# Patient Record
Sex: Female | Born: 1960 | Race: Black or African American | Hispanic: No | Marital: Single | Smoking: Never smoker
Health system: Southern US, Community
[De-identification: ages and names within clinical notes are randomized; demographics above are authoritative.]

## PROBLEM LIST (undated history)

## (undated) DIAGNOSIS — I10 Essential (primary) hypertension: Secondary | ICD-10-CM

## (undated) DIAGNOSIS — R52 Pain, unspecified: Secondary | ICD-10-CM

## (undated) DIAGNOSIS — E119 Type 2 diabetes mellitus without complications: Secondary | ICD-10-CM

---

## 2015-07-23 ENCOUNTER — Emergency Department (HOSPITAL_BASED_OUTPATIENT_CLINIC_OR_DEPARTMENT_OTHER)
Admission: EM | Admit: 2015-07-23 | Discharge: 2015-07-23 | Disposition: A | Discharge status: Home / Self Care | Payer: Worker's Compensation | Attending: Emergency Medicine | Admitting: Emergency Medicine

## 2015-07-23 ENCOUNTER — Encounter (HOSPITAL_BASED_OUTPATIENT_CLINIC_OR_DEPARTMENT_OTHER): Payer: Self-pay

## 2015-07-23 ENCOUNTER — Emergency Department (HOSPITAL_BASED_OUTPATIENT_CLINIC_OR_DEPARTMENT_OTHER): Payer: Worker's Compensation

## 2015-07-23 DIAGNOSIS — M25512 Pain in left shoulder: Secondary | ICD-10-CM | POA: Diagnosis present

## 2015-07-23 DIAGNOSIS — Y999 Unspecified external cause status: Secondary | ICD-10-CM | POA: Insufficient documentation

## 2015-07-23 DIAGNOSIS — E119 Type 2 diabetes mellitus without complications: Secondary | ICD-10-CM | POA: Insufficient documentation

## 2015-07-23 DIAGNOSIS — Y92219 Unspecified school as the place of occurrence of the external cause: Secondary | ICD-10-CM | POA: Diagnosis not present

## 2015-07-23 DIAGNOSIS — I10 Essential (primary) hypertension: Secondary | ICD-10-CM | POA: Diagnosis not present

## 2015-07-23 DIAGNOSIS — S7002XA Contusion of left hip, initial encounter: Secondary | ICD-10-CM

## 2015-07-23 DIAGNOSIS — Z79899 Other long term (current) drug therapy: Secondary | ICD-10-CM | POA: Diagnosis not present

## 2015-07-23 DIAGNOSIS — S43402A Unspecified sprain of left shoulder joint, initial encounter: Secondary | ICD-10-CM | POA: Diagnosis not present

## 2015-07-23 DIAGNOSIS — Y939 Activity, unspecified: Secondary | ICD-10-CM | POA: Insufficient documentation

## 2015-07-23 DIAGNOSIS — W010XXA Fall on same level from slipping, tripping and stumbling without subsequent striking against object, initial encounter: Secondary | ICD-10-CM | POA: Diagnosis not present

## 2015-07-23 DIAGNOSIS — Z7984 Long term (current) use of oral hypoglycemic drugs: Secondary | ICD-10-CM | POA: Insufficient documentation

## 2015-07-23 DIAGNOSIS — W19XXXA Unspecified fall, initial encounter: Secondary | ICD-10-CM

## 2015-07-23 HISTORY — DX: Essential (primary) hypertension: I10

## 2015-07-23 HISTORY — DX: Pain, unspecified: R52

## 2015-07-23 HISTORY — DX: Type 2 diabetes mellitus without complications: E11.9

## 2015-07-23 MED ORDER — ACETAMINOPHEN 325 MG PO TABS
650.0000 mg | ORAL_TABLET | Freq: Once | ORAL | Status: AC
  Administered 2015-07-23: 650 mg via ORAL
  Filled 2015-07-23: qty 2

## 2015-07-23 NOTE — ED Notes (Signed)
Patient transported to X-ray 

## 2015-07-23 NOTE — ED Notes (Signed)
Pt states she fell on floor stripper at school approx 45 min PTA-pain to left shoulder, arm, and hip-NAD-steady gait

## 2015-07-23 NOTE — ED Provider Notes (Signed)
CSN: [id number]     Arrival date & time 07/23/15  1326 History   First MD Initiated Contact with Patient 07/23/15 1351     Chief Complaint  Patient presents with  . Fall     (Consider location/radiation/quality/duration/timing/severity/associated sxs/prior Treatment) HPI  55 year old female presents after slipping on floor stripper at school. 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 Did not hit her head and has no headache. Feels some neck stiffness on the left lateral neck. Mostly is complaining of left shoulder pain with decreased range of motion with pain whenever raising it above 90. Also left hip/iliac crest pain. No weakness or numbness. No chest or abdominal pain. No back pain. Sent here by work to get checked out. Normal ambulation.  Past Medical History  Diagnosis Date  . Diabetes mellitus without complication (HCC)   . Hypertension   . Pain    History reviewed. No pertinent past surgical history. No family history on file. Social History  Substance Use Topics  . Smoking status: Never Smoker   . Smokeless tobacco: None  . Alcohol Use: Yes     Comment: occ   OB History    No data available     Review of Systems  Gastrointestinal: Negative for vomiting.  Musculoskeletal: Positive for arthralgias and neck pain.  Neurological: Negative for dizziness, weakness, numbness and headaches.  All other systems reviewed and are negative.     Allergies  Review of patient's allergies indicates no known allergies.  Home Medications   Prior to Admission medications   Medication Sig Start Date End Date Taking? Authorizing Provider  amLODipine-benazepril (LOTREL) 5-40 MG capsule Take 1 capsule by mouth daily.   Yes Historical Provider, MD  baclofen (LIORESAL) 10 MG tablet Take 10 mg by mouth 3 (three) times daily.   Yes Historical Provider, MD  DULoxetine (CYMBALTA) 60 MG capsule Take 60 mg by mouth  daily.   Yes Historical Provider, MD  gabapentin (NEURONTIN) 100 MG capsule Take 100 mg by mouth 3 (three) times daily.   Yes Historical Provider, MD  hydrochlorothiazide (HYDRODIURIL) 12.5 MG tablet Take 12.5 mg by mouth daily.   Yes Historical Provider, MD  meloxicam (MOBIC) 7.5 MG tablet Take 7.5 mg by mouth daily.   Yes Historical Provider, MD  METFORMIN HCL PO Take by mouth.   Yes Historical Provider, MD  metoCLOPramide (REGLAN) 5 MG tablet Take 5 mg by mouth 4 (four) times daily.   Yes Historical Provider, MD  pioglitazone (ACTOS) 30 MG tablet Take 30 mg by mouth daily.   Yes Historical Provider, MD  ranitidine (ZANTAC) 150 MG tablet Take 150 mg by mouth 2 (two) times daily.   Yes Historical Provider, MD  trospium (SANCTURA) 20 MG tablet Take 20 mg by mouth 2 (two) times daily.   Yes Historical Provider, MD   BP 134/74 mmHg  Pulse 94  Temp(Src) 98.1 F (36.7 C) (Oral)  Resp 18  Ht 5\' 4"  (1.626 m)  Wt 170 lb (77.111 kg)  BMI 29.17 kg/m2  SpO2 100% Physical Exam  Constitutional: She is oriented to person, place, and time. She appears well-developed and well-nourished.  HENT:  Head: Normocephalic and atraumatic.  Right Ear: External ear normal.  Left Ear: External ear normal.  Nose: Nose normal.  Eyes: Right eye exhibits no discharge. Left eye exhibits no discharge.  Neck: Normal range of motion. Neck supple. Muscular  tenderness (mild) present. No spinous process tenderness present. Normal range of motion present.    Cardiovascular: Normal rate, regular rhythm and normal heart sounds.   Pulses:      Radial pulses are 2+ on the right side, and 2+ on the left side.  Pulmonary/Chest: Effort normal and breath sounds normal.  Abdominal: Soft. She exhibits no distension. There is no tenderness.  Musculoskeletal:       Left shoulder: She exhibits decreased range of motion (active, normal passive ROM but with pain beyond 90 degrees) and tenderness. She exhibits no deformity, normal  pulse and normal strength.       Left elbow: She exhibits normal range of motion. No tenderness found.       Left hip: She exhibits tenderness (mild, over left iliac crest).       Left knee: No tenderness found.       Left upper arm: She exhibits no tenderness.       Left upper leg: She exhibits no tenderness.  Neurological: She is alert and oriented to person, place, and time.  Skin: Skin is warm and dry.  Nursing note and vitals reviewed.   ED Course  Procedures (including critical care time) Labs Review Labs Reviewed - No data to display  Imaging Review Dg Shoulder Left  07/23/2015  CLINICAL DATA:  Left shoulder pain after fall today EXAM: LEFT SHOULDER - 2+ VIEW COMPARISON:  None. FINDINGS: No fracture, dislocation or suspicious focal osseous lesion. Mild osteoarthritis in the left acromioclavicular joint. No evidence of left acromioclavicular joint separation. Small enthesophyte at the left coracoid process. Left glenohumeral joint appears within normal limits. Mild hypertrophic change at the left greater tuberosity. IMPRESSION: No fracture or malalignment in the left shoulder. Mild degenerative changes as described. Electronically Signed   By: Delbert PhenixJason A Poff M.D.   On: 07/23/2015 14:21   Dg Hip Unilat With Pelvis 2-3 Views Left  07/23/2015  CLINICAL DATA:  Pain following fall EXAM: DG HIP (WITH OR WITHOUT PELVIS) 2-3V LEFT COMPARISON:  None. FINDINGS: Frontal pelvis as well as frontal and lateral left hip images were obtained. There is no fracture or dislocation. The joint spaces appear normal. No erosive change. Calcification in the pelvis likely represents uterine leiomyomatous change. IMPRESSION: No fracture or dislocation. No apparent arthropathy. Calcification in the pelvis likely due to uterine leiomyomas. Electronically Signed   By: Bretta BangWilliam  Woodruff III M.D.   On: 07/23/2015 14:22   I have personally reviewed and evaluated these images and lab results as part of my medical  decision-making.   EKG Interpretation None      MDM   Final diagnoses:  Fall, initial encounter  Shoulder sprain, left, initial encounter  Contusion of left hip, initial encounter    Patient with a mechanical fall and left-sided injuries. Neurovascularly intact. X-rays are unremarkable. No midline neck tenderness or signs of head injury. Patient will be discharged home with Tylenol and ice. States she cannot take ibuprofen due to stomach issues. Discussed return precautions and follow-up with PCP if not improving.    Nancy LovelessScott Charolett Yarrow, MD 07/23/15 [phone]

## 2017-09-15 ENCOUNTER — Emergency Department (HOSPITAL_BASED_OUTPATIENT_CLINIC_OR_DEPARTMENT_OTHER): Payer: BC Managed Care – PPO

## 2017-09-15 ENCOUNTER — Encounter (HOSPITAL_BASED_OUTPATIENT_CLINIC_OR_DEPARTMENT_OTHER): Payer: Self-pay | Admitting: Emergency Medicine

## 2017-09-15 ENCOUNTER — Other Ambulatory Visit: Payer: Self-pay

## 2017-09-15 ENCOUNTER — Emergency Department (HOSPITAL_BASED_OUTPATIENT_CLINIC_OR_DEPARTMENT_OTHER)
Admission: EM | Admit: 2017-09-15 | Discharge: 2017-09-15 | Disposition: A | Discharge status: Home / Self Care | Payer: BC Managed Care – PPO | Attending: Emergency Medicine | Admitting: Emergency Medicine

## 2017-09-15 DIAGNOSIS — E119 Type 2 diabetes mellitus without complications: Secondary | ICD-10-CM | POA: Insufficient documentation

## 2017-09-15 DIAGNOSIS — M25562 Pain in left knee: Secondary | ICD-10-CM | POA: Diagnosis present

## 2017-09-15 DIAGNOSIS — Z79899 Other long term (current) drug therapy: Secondary | ICD-10-CM | POA: Diagnosis not present

## 2017-09-15 DIAGNOSIS — M5136 Other intervertebral disc degeneration, lumbar region: Secondary | ICD-10-CM

## 2017-09-15 DIAGNOSIS — M5432 Sciatica, left side: Secondary | ICD-10-CM

## 2017-09-15 DIAGNOSIS — Z7984 Long term (current) use of oral hypoglycemic drugs: Secondary | ICD-10-CM | POA: Insufficient documentation

## 2017-09-15 DIAGNOSIS — M51369 Other intervertebral disc degeneration, lumbar region without mention of lumbar back pain or lower extremity pain: Secondary | ICD-10-CM

## 2017-09-15 DIAGNOSIS — I10 Essential (primary) hypertension: Secondary | ICD-10-CM | POA: Insufficient documentation

## 2017-09-15 MED ORDER — KETOROLAC TROMETHAMINE 60 MG/2ML IM SOLN
60.0000 mg | Freq: Once | INTRAMUSCULAR | Status: AC
  Administered 2017-09-15: 60 mg via INTRAMUSCULAR
  Filled 2017-09-15: qty 2

## 2017-09-15 MED ORDER — HYDROCODONE-ACETAMINOPHEN 5-325 MG PO TABS: 1.0000 | ORAL_TABLET | Freq: Four times a day (QID) | ORAL | 0 refills | Status: AC | PRN

## 2017-09-15 MED ORDER — NAPROXEN 375 MG PO TABS: 375.0000 mg | ORAL_TABLET | Freq: Two times a day (BID) | ORAL | 0 refills | Status: AC

## 2017-09-15 MED ORDER — PREDNISONE 20 MG PO TABS: 60.0000 mg | ORAL_TABLET | Freq: Every day | ORAL | 0 refills | Status: AC

## 2017-09-15 MED ORDER — PREDNISONE 10 MG PO TABS
60.0000 mg | ORAL_TABLET | Freq: Once | ORAL | Status: AC
  Administered 2017-09-15: 60 mg via ORAL
  Filled 2017-09-15: qty 1

## 2017-09-15 NOTE — ED Triage Notes (Signed)
PT presents with c/o left knee pain for a week

## 2017-09-15 NOTE — ED Notes (Signed)
Pt understood dc material. NAD noted. 

## 2017-09-15 NOTE — ED Notes (Signed)
Patient transported to XR. 

## 2017-09-15 NOTE — ED Provider Notes (Signed)
[facility] EMERGENCY DEPARTMENT Provider Note   CSN: [id number] Arrival date & time: 09/15/17  1935     History   Chief Complaint Chief Complaint  Patient presents with  . Knee Pain    HPI Nancy Robinson is a 57 y.o. female.  HPI 57 year old female with past medical history of hypertension and diabetes here with left leg pain.  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  She just had a steroid injection there this week which did not improve it.  She has had aching, throbbing, left knee pain for the last week and a half, since she returned to work.  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  She denies any direct trauma.  She has not had any fever, redness, or pain with range of motion.  Patient has also had some left hip pain and shooting, stabbing pain down her posterior left leg down to her feet.  She feels hot tingling as well.  This is also new since she went back to work.  She did not fall.  No loss of bowel or bladder function.  No weakness or numbness.  Past Medical History:  Diagnosis Date  . Diabetes mellitus without complication (HCC)   . Hypertension   . Pain     There are no active problems to display for this patient.   History reviewed. No pertinent surgical history.   OB History   None      Home Medications    Prior to Admission medications   Medication Sig Start Date End Date Taking? Authorizing Provider  amLODipine-benazepril (LOTREL) 5-40 MG capsule Take 1 capsule by mouth daily.    [provider]  baclofen (LIORESAL) 10 MG tablet Take 10 mg by mouth 3 (three) times daily.    [provider]  DULoxetine (CYMBALTA) 60 MG capsule Take 60 mg by mouth daily.    [provider]  gabapentin (NEURONTIN) 100 MG capsule Take 100 mg by mouth 3 (three) times daily.    [provider]  hydrochlorothiazide (HYDRODIURIL) 12.5 MG tablet Take 12.5 mg  by mouth daily.    [provider]  HYDROcodone-acetaminophen (NORCO/VICODIN) 5-325 MG tablet Take 1-2 tablets by mouth every 6 (six) hours as needed for severe pain. 09/15/17   Shaune PollackIsaacs, Sofiya Ezelle, MD  meloxicam (MOBIC) 7.5 MG tablet Take 7.5 mg by mouth daily.    [provider]  METFORMIN HCL PO Take by mouth.    [provider]  metoCLOPramide (REGLAN) 5 MG tablet Take 5 mg by mouth 4 (four) times daily.    [provider]  naproxen (NAPROSYN) 375 MG tablet Take 1 tablet (375 mg total) by mouth 2 (two) times daily with a meal for 7 days. 09/15/17 09/22/17  Shaune PollackIsaacs, Darcel Frane, MD  pioglitazone (ACTOS) 30 MG tablet Take 30 mg by mouth daily.    [provider]  predniSONE (DELTASONE) 20 MG tablet Take 3 tablets (60 mg total) by mouth daily for 5 days. 09/15/17 09/20/17  Shaune PollackIsaacs, Velia Pamer, MD  ranitidine (ZANTAC) 150 MG tablet Take 150 mg by mouth 2 (two) times daily.    [provider]  trospium (SANCTURA) 20 MG tablet Take 20 mg by mouth 2 (two) times daily.    [provider]    Family History No family history on file.  Social History Social History   Tobacco Use  . Smoking status: Never  Smoker  Substance Use Topics  . Alcohol use: Yes    Comment: occ  . Drug use: No     Allergies   Patient has no known allergies.   Review of Systems Review of Systems  Constitutional: Negative for chills, fatigue and fever.  HENT: Negative for congestion and rhinorrhea.   Eyes: Negative for visual disturbance.  Respiratory: Negative for cough, shortness of breath and wheezing.   Cardiovascular: Negative for chest pain and leg swelling.  Gastrointestinal: Negative for abdominal pain, diarrhea, nausea and vomiting.  Genitourinary: Negative for dysuria and flank pain.  Musculoskeletal: Positive for arthralgias, gait problem and joint swelling. Negative for neck pain and neck stiffness.  Skin: Negative for rash and wound.  Allergic/Immunologic:  Negative for immunocompromised state.  Neurological: Negative for syncope, weakness and headaches.  All other systems reviewed and are negative.    Physical Exam Updated Vital Signs BP 124/76   Pulse 72   Temp 98.6 F (37 C) (Oral)   Resp 18   Ht 5\' 4"  (1.626 m)   Wt 82.6 kg   SpO2 98%   BMI 31.24 kg/m   Physical Exam  Constitutional: She is oriented to person, place, and time. She appears well-developed and well-nourished. No distress.  HENT:  Head: Normocephalic and atraumatic.  Eyes: Conjunctivae are normal.  Neck: Neck supple.  Cardiovascular: Normal rate, regular rhythm and normal heart sounds. Exam reveals no friction rub.  No murmur heard. Pulmonary/Chest: Effort normal and breath sounds normal. No respiratory distress. She has no wheezes. She has no rales.  Abdominal: She exhibits no distension.  Musculoskeletal: She exhibits no edema.  Moderate tenderness over the left SI joint overlying sciatic nerve.  Positive straight leg raise on the left.    Neurological: She is alert and oriented to person, place, and time. She exhibits normal muscle tone.  Skin: Skin is warm. Capillary refill takes less than 2 seconds.  Psychiatric: She has a normal mood and affect.  Nursing note and vitals reviewed.   Marland KitchenSpine Exam: Inspection/Palpation: Moderate TTP over left SI joint. Positive straight leg raise test on left. Strength: 5/5 throughout LE bilaterally (hip flexion/extension, adduction/abduction; knee flexion/extension; foot dorsiflexion/plantarflexion, inversion/eversion; great toe inversion) Sensation: Intact to light touch in proximal and distal LE bilaterally Reflexes: 2+ quadriceps and achilles reflexes   LOWER EXTREMITY EXAM: LEFT  INSPECTION & PALPATION: Moderate effusion/swelling overlying left knee. No erythema, warmth. No pain with pROM. Mild diffuse TTP over anterior and lateral joint lines. No laxity.  SENSORY: sensation is intact to light touch in:    Superficial peroneal nerve distribution (over dorsum of foot) Deep peroneal nerve distribution (over first dorsal web space) Sural nerve distribution (over lateral aspect 5th metatarsal) Saphenous nerve distribution (over medial instep)  MOTOR:  + Motor EHL (great toe dorsiflexion) + FHL (great toe plantar flexion)  + TA (ankle dorsiflexion)  + GSC (ankle plantar flexion)  VASCULAR: 2+ dorsalis pedis and posterior tibialis pulses Capillary refill < 2 sec, toes warm and well-perfused  COMPARTMENTS: Soft, warm, well-perfused No pain with passive extension No parethesias    ED Treatments / Results  Labs (all labs ordered are listed, but only abnormal results are displayed) Labs Reviewed - No data to display  EKG None  Radiology Dg Lumbar Spine Complete  Result Date: 09/15/2017 CLINICAL DATA:  57 year old female with back pain. EXAM: LUMBAR SPINE - COMPLETE 4+ VIEW COMPARISON:  Abdominal radiograph dated 08/20/2015 and lumbar spine radiograph dated 04/09/2011 FINDINGS: There is no acute  fracture or subluxation of the lumbar spine. There is multilevel degenerative changes with endplate irregularity and disc space narrowing primarily at L4-L5. There is apparent mild narrowing of the neural foramina at this level. Lower lumbar facet arthropathy noted. There is grade 1 L5-S1 retrolisthesis. Amorphous calcific density in the pelvis most consistent with fibroid. The soft tissues are grossly unremarkable. IMPRESSION: 1. No acute/traumatic lumbar spine pathology. 2. Degenerative changes. Electronically Signed   By: Elgie Collard M.D.   On: 09/15/2017 22:18   Dg Knee Complete 4 Views Left  Result Date: 09/15/2017 CLINICAL DATA:  57 year old female with left knee pain. EXAM: LEFT KNEE - COMPLETE 4+ VIEW COMPARISON:  None. FINDINGS: There is no acute fracture or dislocation. Minimal arthritic changes and bone spurring along the posterior aspect of the lateral tibial plateau. The bones are  mildly osteopenic. No joint effusion. There is swelling of the subcutaneous soft tissues over the patella and patella tendon which may represent a small hematoma or contusion. Clinical correlation is recommended. IMPRESSION: 1. No acute fracture or dislocation. 2. Small contusion or hematoma in the subcutaneous soft tissues of the anterior knee. Electronically Signed   By: Elgie Collard M.D.   On: 09/15/2017 22:23    Procedures Procedures (including critical care time)  Medications Ordered in ED Medications  ketorolac (TORADOL) injection 60 mg (60 mg Intramuscular Given 09/15/17 2213)  predniSONE (DELTASONE) tablet 60 mg (60 mg Oral Given 09/15/17 2213)     Initial Impression / Assessment and Plan / ED Course  I have reviewed the triage vital signs and the nursing notes.  Pertinent labs & imaging results that were available during my care of the patient were reviewed by me and considered in my medical decision making (see chart for details).     57 yo F here with left knee and back pain. Regarding her knee pain, this is a chronic, ongoing issue 2/2 OA. She just had a steroid injection. She does have some swelling there but no erythema, warmth, fevers, and I can passively range knee w/o any pain - doubt septic or inflammatory arthritis. Will place compression sleeve, advise NSAIDs. Regarding her posterior leg and hip pain, this localizes to sciatic nerve distirbution which I suspect she's exacerbated 2/2 her gait changes from her knees. She has no signs of cauda equina or cord compression. Will give brief course of steroids, analgesics, and refer for outpt follow-up and PT.  Final Clinical Impressions(s) / ED Diagnoses   Final diagnoses:  Sciatica, left side  DDD (degenerative disc disease), lumbar    ED Discharge Orders         Ordered    predniSONE (DELTASONE) 20 MG tablet  Daily     09/15/17 2309    naproxen (NAPROSYN) 375 MG tablet  2 times daily with meals     09/15/17 2309     HYDROcodone-acetaminophen (NORCO/VICODIN) 5-325 MG tablet  Every 6 hours PRN     09/15/17 2309           Shaune Pollack, MD 09/15/17 2357

## 2019-03-06 ENCOUNTER — Emergency Department (HOSPITAL_BASED_OUTPATIENT_CLINIC_OR_DEPARTMENT_OTHER)
Admission: EM | Admit: 2019-03-06 | Discharge: 2019-03-06 | Disposition: A | Discharge status: Home / Self Care | Payer: No Typology Code available for payment source | Attending: Emergency Medicine | Admitting: Emergency Medicine

## 2019-03-06 ENCOUNTER — Other Ambulatory Visit: Payer: Self-pay

## 2019-03-06 ENCOUNTER — Encounter (HOSPITAL_BASED_OUTPATIENT_CLINIC_OR_DEPARTMENT_OTHER): Payer: Self-pay | Admitting: *Deleted

## 2019-03-06 DIAGNOSIS — Y990 Civilian activity done for income or pay: Secondary | ICD-10-CM | POA: Insufficient documentation

## 2019-03-06 DIAGNOSIS — Y9389 Activity, other specified: Secondary | ICD-10-CM | POA: Diagnosis not present

## 2019-03-06 DIAGNOSIS — S46911A Strain of unspecified muscle, fascia and tendon at shoulder and upper arm level, right arm, initial encounter: Secondary | ICD-10-CM | POA: Diagnosis not present

## 2019-03-06 DIAGNOSIS — Z7984 Long term (current) use of oral hypoglycemic drugs: Secondary | ICD-10-CM | POA: Diagnosis not present

## 2019-03-06 DIAGNOSIS — X500XXA Overexertion from strenuous movement or load, initial encounter: Secondary | ICD-10-CM | POA: Insufficient documentation

## 2019-03-06 DIAGNOSIS — Y92219 Unspecified school as the place of occurrence of the external cause: Secondary | ICD-10-CM | POA: Diagnosis not present

## 2019-03-06 DIAGNOSIS — S4991XA Unspecified injury of right shoulder and upper arm, initial encounter: Secondary | ICD-10-CM | POA: Diagnosis present

## 2019-03-06 DIAGNOSIS — I10 Essential (primary) hypertension: Secondary | ICD-10-CM | POA: Diagnosis not present

## 2019-03-06 DIAGNOSIS — Z79899 Other long term (current) drug therapy: Secondary | ICD-10-CM | POA: Diagnosis not present

## 2019-03-06 DIAGNOSIS — E119 Type 2 diabetes mellitus without complications: Secondary | ICD-10-CM | POA: Diagnosis not present

## 2019-03-06 MED ORDER — TRAMADOL HCL 50 MG PO TABS
50.0000 mg | ORAL_TABLET | Freq: Four times a day (QID) | ORAL | 0 refills | Status: AC | PRN
Start: 2019-03-06 — End: ?

## 2019-03-06 MED ORDER — CYCLOBENZAPRINE HCL 5 MG PO TABS: 5.0000 mg | ORAL_TABLET | Freq: Three times a day (TID) | ORAL | 0 refills | Status: AC | PRN

## 2019-03-06 MED FILL — traMADol HCL 50 MG TABS: 50 | 2 days supply | Qty: 10 | Fill #0

## 2019-03-06 MED FILL — CYCLOBENZAPRINE HCL 5 MG TA: 5 | 4 days supply | Qty: 10 | Fill #0

## 2019-03-06 NOTE — ED Provider Notes (Signed)
[facility] EMERGENCY DEPARTMENT Provider Note   CSN: [id number] Arrival date & time: 03/06/19  1209     History Chief Complaint  Patient presents with  . Extremity Pain    Nancy Robinson is a 59 y.o. female hx of DM, HTN, here presenting with right shoulder pain. 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  She states that she may have strained her right shoulder.  She definitely denies any falls or injury or trauma to the shoulder.  She states that yesterday when she left work, she is very sore in the right shoulder and she woke up this morning and she states that she is unable to lift up her arm.  Denies any neck pain and denies any numbness or weakness in the arm.  The history is provided by the patient.       Past Medical History:  Diagnosis Date  . Diabetes mellitus without complication ([place])   . Hypertension   . Pain     There are no problems to display for this patient.   History reviewed. No pertinent surgical history.   OB History   No obstetric history on file.     History reviewed. No pertinent family history.  Social History   Tobacco Use  . Smoking status: Never Smoker  Substance Use Topics  . Alcohol use: Yes    Comment: occ  . Drug use: No    Home Medications Prior to Admission medications   Medication Sig Start Date End Date Taking? Authorizing Provider  amLODipine-benazepril (LOTREL) 5-40 MG capsule Take 1 capsule by mouth daily.   Yes [provider]  baclofen (LIORESAL) 10 MG tablet Take 10 mg by mouth 3 (three) times daily.   Yes [provider]  DULoxetine (CYMBALTA) 60 MG capsule Take 60 mg by mouth daily.   Yes [provider]  gabapentin (NEURONTIN) 100 MG capsule Take 100 mg by mouth 3 (three) times daily.   Yes [provider]  hydrochlorothiazide (HYDRODIURIL) 12.5 MG tablet Take 12.5 mg by mouth daily.   Yes  [provider]  HYDROcodone-acetaminophen (NORCO/VICODIN) 5-325 MG tablet Take 1-2 tablets by mouth every 6 (six) hours as needed for severe pain. 09/15/17  Yes Duffy Bruce, MD  meloxicam (MOBIC) 7.5 MG tablet Take 7.5 mg by mouth daily.   Yes [provider]  METFORMIN HCL PO Take by mouth.   Yes [provider]  metoCLOPramide (REGLAN) 5 MG tablet Take 5 mg by mouth 4 (four) times daily.   Yes [provider]  ranitidine (ZANTAC) 150 MG tablet Take 150 mg by mouth 2 (two) times daily.   Yes [provider]  trospium (SANCTURA) 20 MG tablet Take 20 mg by mouth 2 (two) times daily.   Yes [provider]  pioglitazone (ACTOS) 30 MG tablet Take 30 mg by mouth daily.    [provider]    Allergies    Patient has no known allergies.  Review of Systems   Review of Systems  Musculoskeletal:       R shoulder pain   All other systems reviewed and are negative.   Physical Exam Updated Vital Signs BP 140/72 (BP Location: Left Arm)   Pulse 80   Temp 97.9 F (36.6 C) (Oral)   Resp 20   Ht 5\' 4"  (1.626 m)   Wt 74.8 kg   BMI 28.32 kg/m  Physical Exam Vitals and nursing note reviewed.  Constitutional:      Appearance: Normal appearance.  HENT:     Head: Normocephalic and atraumatic.     Nose: Nose normal.     Mouth/Throat:     Mouth: Mucous membranes are moist.  Eyes:     Extraocular Movements: Extraocular movements intact.     Conjunctiva/sclera: Conjunctivae normal.     Pupils: Pupils are equal, round, and reactive to light.  Cardiovascular:     Rate and Rhythm: Normal rate.     Pulses: Normal pulses.  Pulmonary:     Effort: Pulmonary effort is normal.  Abdominal:     General: Abdomen is flat.  Musculoskeletal:     Cervical back: Normal range of motion.     Comments: + R deltoid tenderness, difficulty abducting the shoulder due to pain.  There is no obvious joint effusion or dislocation.  She does have  normal range of motion despite the pain.  Normal radial pulses and neurovascular intact in the right upper extremity    Skin:    General: Skin is warm.     Capillary Refill: Capillary refill takes less than 2 seconds.  Neurological:     General: No focal deficit present.     Mental Status: She is alert.  Psychiatric:        Mood and Affect: Mood normal.     ED Results / Procedures / Treatments   Labs (all labs ordered are listed, but only abnormal results are displayed) Labs Reviewed - No data to display  EKG None  Radiology No results found.  Procedures Procedures (including critical care time)  Medications Ordered in ED Medications - No data to display  ED Course  I have reviewed the triage vital signs and the nursing notes.  Pertinent labs & imaging results that were available during my care of the patient were reviewed by me and considered in my medical decision making (see chart for details).    MDM Rules/Calculators/A&P                      Nancy Robinson is a 59 y.o. female who presented with right shoulder pain.  There is no trauma or injury but she was lifting heavy items yesterday.  I think she can have a rotator cuff strain versus deltoid muscle strain.  She is neurovascular intact in right upper extremity.  Recommend ibuprofen and tramadol for pain.  We will also give a short course of Flexeril as well. Will give sling for comfort and refer to ortho   Final Clinical Impression(s) / ED Diagnoses Final diagnoses:  None    Rx / DC Orders ED Discharge Orders    None       Charlynne Pander, MD 03/06/19 1236

## 2019-03-06 NOTE — ED Triage Notes (Signed)
Presents with rt upper arm pain, began yesterday. Was moving furniture at the school. Pain became worse this am. Denies any fall or trauma to rt upper arm area

## 2019-03-06 NOTE — Discharge Instructions (Addendum)
Take motrin for pain   Take flexeril for muscle spasms   Take tramadol for severe pain   Using sling for comfort.   See ortho for follow up   Return to ER if you have worse shoulder pain, numbness, weakness

## 2020-03-21 IMAGING — CR DG LUMBAR SPINE COMPLETE 4+V
5 series · 5 of 5 positions shown · non-contrast
Comparison: Abdominal radiograph dated 08/20/2015 and lumbar spine
radiograph dated 04/09/2011

CLINICAL DATA: 57-year-old female with back pain.

EXAM:
LUMBAR SPINE - COMPLETE 4+ VIEW

[t l-spine a.p.]
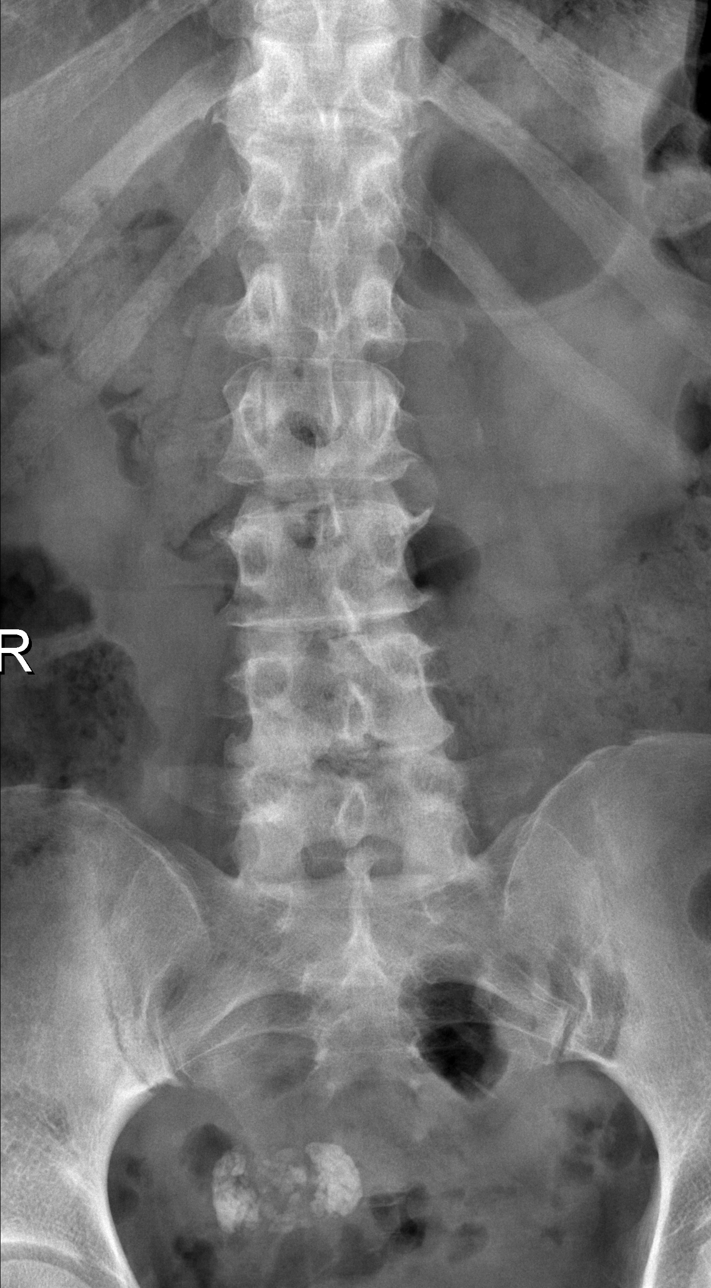

[t l-spine oblique exposure (1 of 2)]
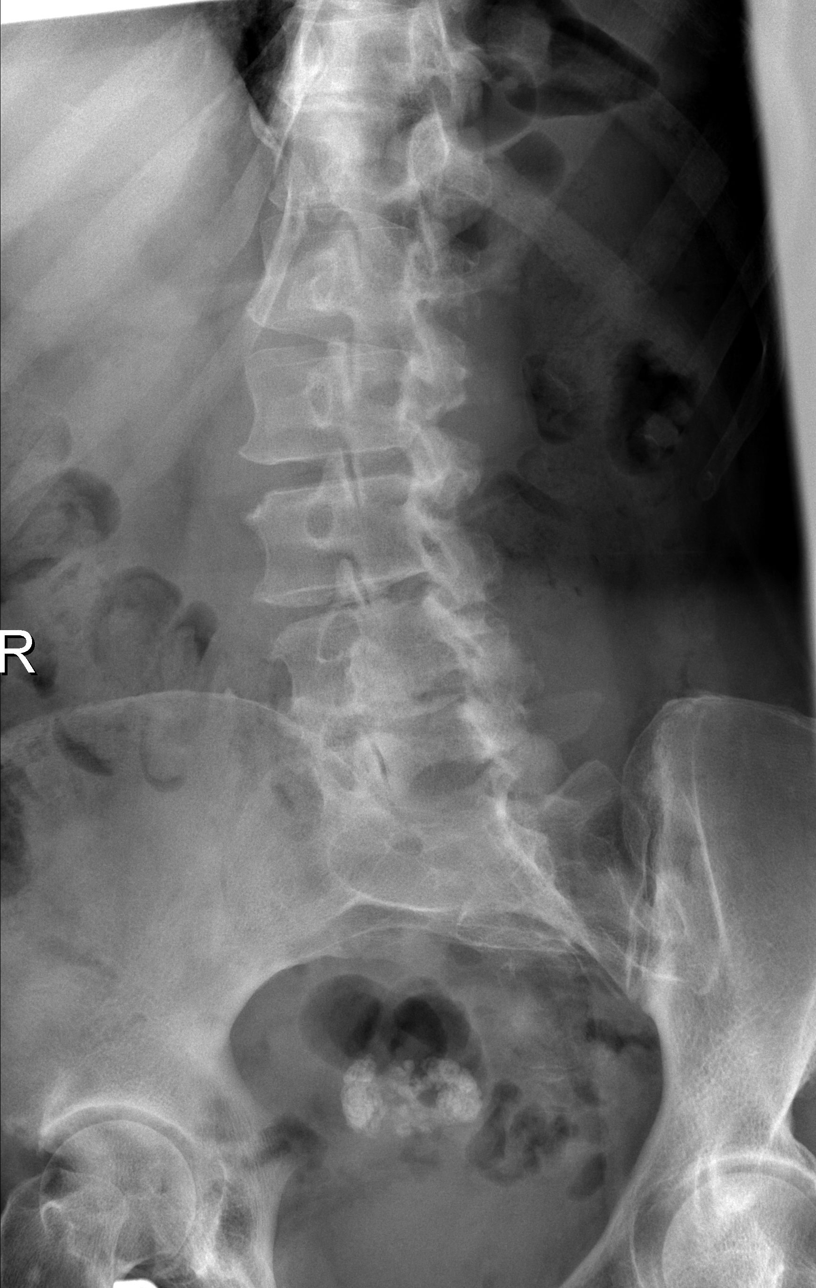

[t l-spine oblique exposure (2 of 2)]
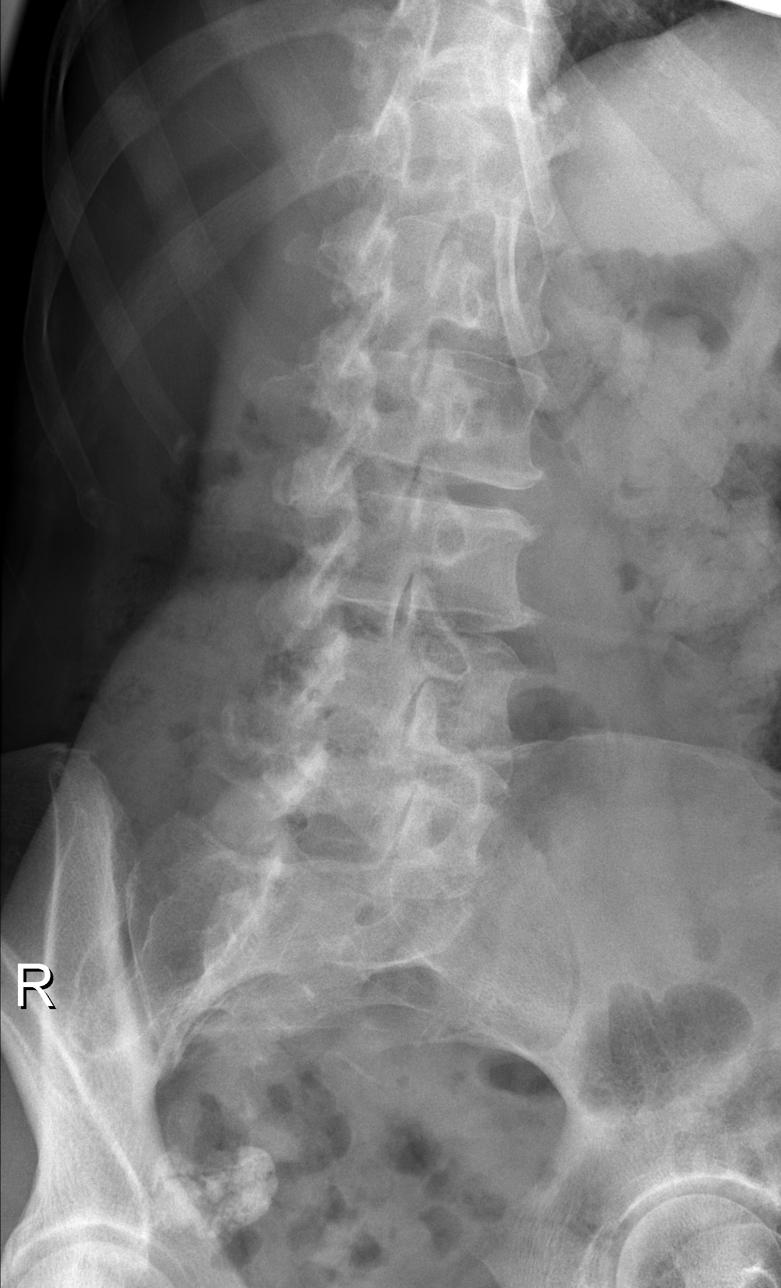

[t l-spine lat]
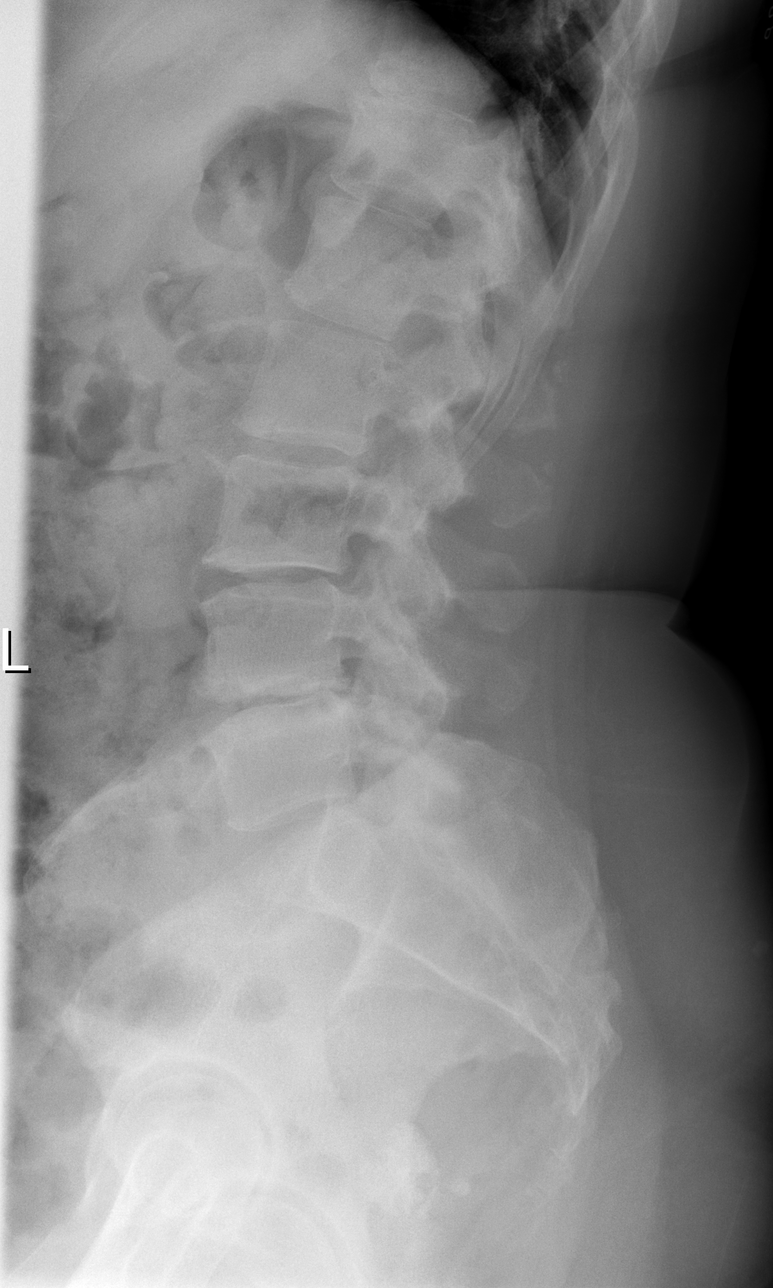

[t l-spine l5-s1 spot]
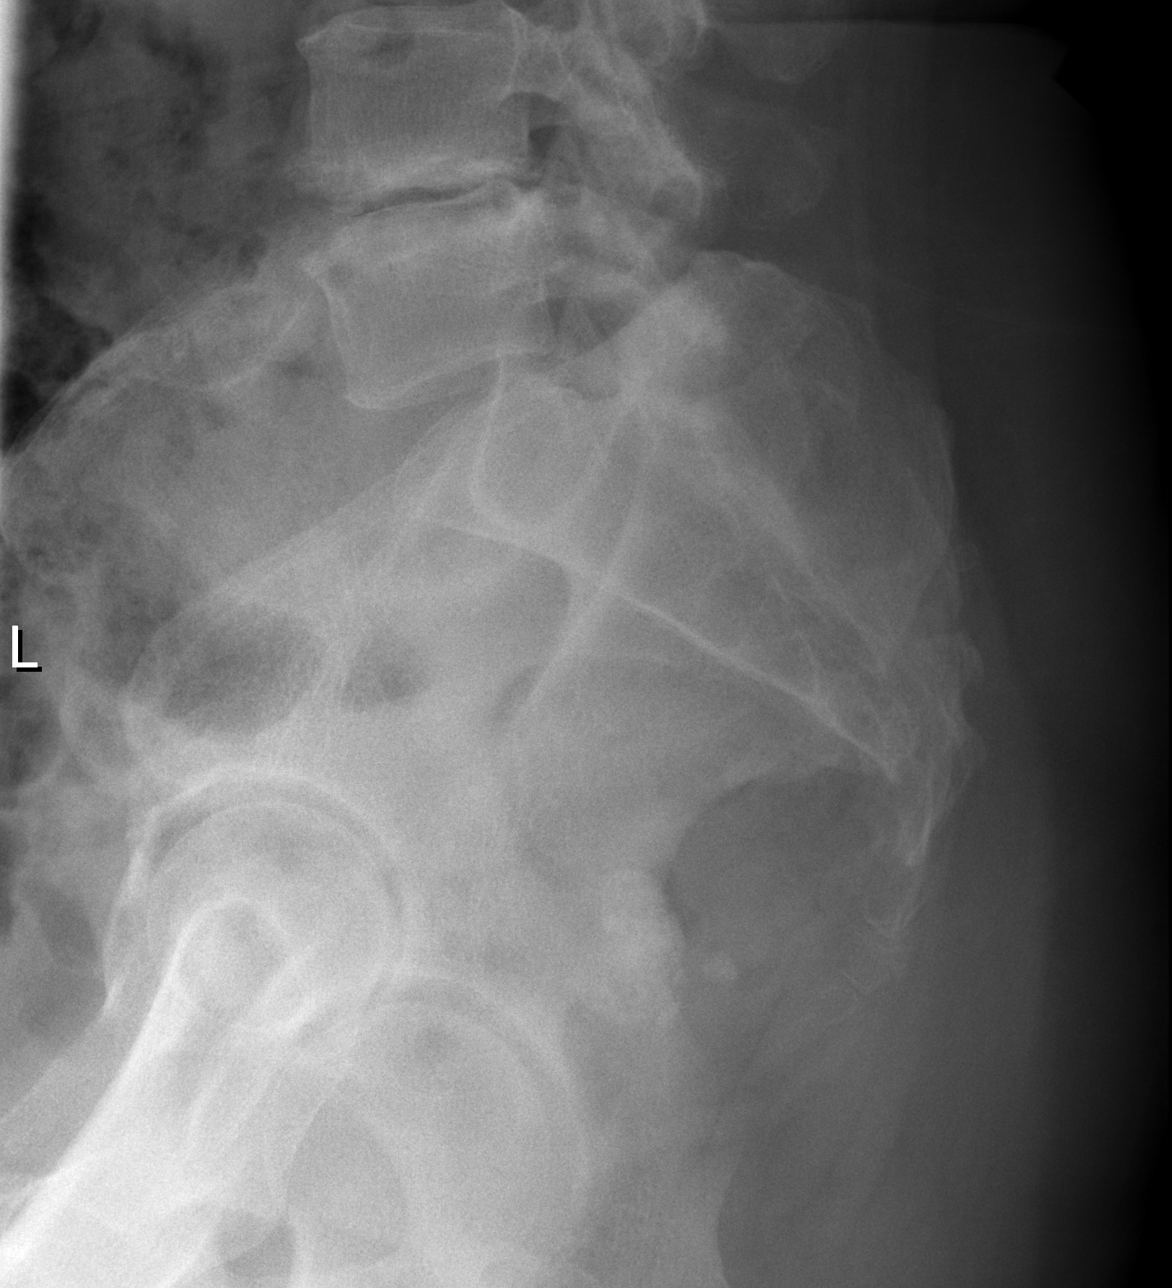

[5 of 5 positions shown; findings below may reference images not displayed]

FINDINGS: There is no acute fracture or subluxation of the lumbar spine. There
is multilevel degenerative changes with endplate irregularity and
disc space narrowing primarily at L4-L5. There is apparent mild
narrowing of the neural foramina at this level. Lower lumbar facet
arthropathy noted. There is grade 1 L5-S1 retrolisthesis.

Amorphous calcific density in the pelvis most consistent with
fibroid. The soft tissues are grossly unremarkable.
IMPRESSION: 1. No acute/traumatic lumbar spine pathology.
2. Degenerative changes.

## 2023-04-13 ENCOUNTER — Emergency Department (HOSPITAL_BASED_OUTPATIENT_CLINIC_OR_DEPARTMENT_OTHER)
Admission: EM | Admit: 2023-04-13 | Discharge: 2023-04-13 | Disposition: A | Discharge status: Home / Self Care | Attending: Emergency Medicine | Admitting: Emergency Medicine

## 2023-04-13 ENCOUNTER — Encounter (HOSPITAL_BASED_OUTPATIENT_CLINIC_OR_DEPARTMENT_OTHER): Payer: Self-pay | Admitting: *Deleted

## 2023-04-13 ENCOUNTER — Other Ambulatory Visit: Payer: Self-pay

## 2023-04-13 ENCOUNTER — Emergency Department (HOSPITAL_BASED_OUTPATIENT_CLINIC_OR_DEPARTMENT_OTHER)

## 2023-04-13 DIAGNOSIS — Z7984 Long term (current) use of oral hypoglycemic drugs: Secondary | ICD-10-CM | POA: Insufficient documentation

## 2023-04-13 DIAGNOSIS — Z79899 Other long term (current) drug therapy: Secondary | ICD-10-CM | POA: Insufficient documentation

## 2023-04-13 DIAGNOSIS — S0990XA Unspecified injury of head, initial encounter: Secondary | ICD-10-CM | POA: Diagnosis present

## 2023-04-13 DIAGNOSIS — W01190A Fall on same level from slipping, tripping and stumbling with subsequent striking against furniture, initial encounter: Secondary | ICD-10-CM | POA: Insufficient documentation

## 2023-04-13 DIAGNOSIS — W19XXXA Unspecified fall, initial encounter: Secondary | ICD-10-CM

## 2023-04-13 DIAGNOSIS — E119 Type 2 diabetes mellitus without complications: Secondary | ICD-10-CM | POA: Insufficient documentation

## 2023-04-13 DIAGNOSIS — I10 Essential (primary) hypertension: Secondary | ICD-10-CM | POA: Insufficient documentation

## 2023-04-13 NOTE — Discharge Instructions (Addendum)
 You were evaluated in the emergency room following a mechanical fall.  Your imaging did not show any abnormality.  Your exam is consistent with a concussion.  You are provided information regarding this.  You may alternate Tylenol with ibuprofen for headache.  If your symptoms persist please follow-up with your primary care doctor.

## 2023-04-13 NOTE — ED Triage Notes (Signed)
 Pt states that she tripped and fell and hit her head on the ground on Friday.  No LOC.  Pt states that she has continued to have a headache since.  No blood thinners, no focal weakness, pt had a stroke in January without deficits.

## 2023-04-13 NOTE — ED Notes (Signed)
 Pt. Reports a fall a week ago landing face first on a wood floor.  Pt. Reports tenderness to the front of her head and on the top of her head.  No LOC at time of the fall and no bruising noted to her face.  Pt. Has no S/S of slurred speech and  no facial droops.  Pt. Able to recite the incident/ accident and tells RN she has history of headaches.

## 2023-04-13 NOTE — ED Provider Notes (Signed)
 [facility] Provider Note   CSN: [id number] Arrival date & time: 04/13/23  1216     History  Chief Complaint  Patient presents with   Nancy Robinson is a 63 y.o. female with history of diabetes, hypertension, stroke presents following a mechanical fall a week ago.  Patient states she tripped on the doorway and fell face first hitting her head on the floor.  Denies any loss of consciousness.  She does take aspirin daily.  Reports persistent headache. No blurry vision or extremity weakness.    Fall Associated symptoms include headaches.   Past Medical History:  Diagnosis Date   Diabetes mellitus without complication (HCC)    Hypertension    Pain        Home Medications Prior to Admission medications   Medication Sig Start Date End Date Taking? Authorizing Provider  amLODipine-benazepril (LOTREL) 5-40 MG capsule Take 1 capsule by mouth daily.    [provider]  baclofen (LIORESAL) 10 MG tablet Take 10 mg by mouth 3 (three) times daily.    [provider]  cyclobenzaprine (FLEXERIL) 5 MG tablet Take 1 tablet (5 mg total) by mouth 3 (three) times daily as needed for muscle spasms. 03/06/19   Charlynne Pander, MD  DULoxetine (CYMBALTA) 60 MG capsule Take 60 mg by mouth daily.    [provider]  gabapentin (NEURONTIN) 100 MG capsule Take 100 mg by mouth 3 (three) times daily.    [provider]  hydrochlorothiazide (HYDRODIURIL) 12.5 MG tablet Take 12.5 mg by mouth daily.    [provider]  HYDROcodone-acetaminophen (NORCO/VICODIN) 5-325 MG tablet Take 1-2 tablets by mouth every 6 (six) hours as needed for severe pain. 09/15/17   Shaune Pollack, MD  meloxicam (MOBIC) 7.5 MG tablet Take 7.5 mg by mouth daily.    [provider]  METFORMIN HCL PO Take by mouth.    [provider]  metoCLOPramide (REGLAN) 5 MG tablet Take 5 mg by mouth 4 (four) times daily.     [provider]  pioglitazone (ACTOS) 30 MG tablet Take 30 mg by mouth daily.    [provider]  ranitidine (ZANTAC) 150 MG tablet Take 150 mg by mouth 2 (two) times daily.    [provider]  traMADol (ULTRAM) 50 MG tablet Take 1 tablet (50 mg total) by mouth every 6 (six) hours as needed. 03/06/19   Charlynne Pander, MD  trospium (SANCTURA) 20 MG tablet Take 20 mg by mouth 2 (two) times daily.    [provider]      Allergies    Patient has no known allergies.    Review of Systems   Review of Systems  Neurological:  Positive for headaches.    Physical Exam Updated Vital Signs BP (!) 165/80   Pulse 63   Temp 98.7 F (37.1 C) (Oral)   Resp 16   SpO2 100%  Physical Exam Vitals and nursing note reviewed.  Constitutional:      General: She is not in acute distress.    Appearance: She is well-developed.  HENT:     Head: Normocephalic and atraumatic.  Eyes:     Conjunctiva/sclera: Conjunctivae normal.  Cardiovascular:     Rate and Rhythm: Normal rate and regular rhythm.     Heart sounds: No murmur heard. Pulmonary:     Effort: Pulmonary effort is normal. No respiratory distress.     Breath sounds: Normal  breath sounds.  Abdominal:     Palpations: Abdomen is soft.     Tenderness: There is no abdominal tenderness.  Musculoskeletal:        General: No swelling.     Cervical back: Neck supple.  Skin:    General: Skin is warm and dry.     Capillary Refill: Capillary refill takes less than 2 seconds.  Neurological:     Mental Status: She is alert.     Comments: Patient is alert and oriented. There is no abnormal phonation. Symmetric smile without facial droop. Moves all extremities spontaneously. 5/5 strength in upper and lower extremities. . No sensation deficit. There is no nystagmus. EOMI, PERRL. Coordination intact with finger to nose and normal ambulation.    Psychiatric:        Mood and Affect: Mood normal.     ED Results /  Procedures / Treatments   Labs (all labs ordered are listed, but only abnormal results are displayed) Labs Reviewed - No data to display  EKG None  Radiology CT Head Wo Contrast Result Date: 04/13/2023 CLINICAL DATA:  Head trauma, minor, normal mental status (Age 72-64y); Neck trauma, uncomplicated (NEXUS/CCR neg) (Age 47-64y). Fall with head strike. Headache. EXAM: CT HEAD WITHOUT CONTRAST CT CERVICAL SPINE WITHOUT CONTRAST TECHNIQUE: Multidetector CT imaging of the head and cervical spine was performed following the standard protocol without intravenous contrast. Multiplanar CT image reconstructions of the cervical spine were also generated. RADIATION DOSE REDUCTION: This exam was performed according to the departmental dose-optimization program which includes automated exposure control, adjustment of the mA and/or kV according to patient size and/or use of iterative reconstruction technique. COMPARISON:  Head CT 03/06/2023.  MRI brain 02/05/2023. FINDINGS: CT HEAD FINDINGS Brain: No acute intracranial hemorrhage. Gray-white differentiation is preserved. No hydrocephalus or extra-axial collection. No mass effect or midline shift. Vascular: No hyperdense vessel or unexpected calcification. Skull: No calvarial fracture or suspicious bone lesion. Skull base is unremarkable. Sinuses/Orbits: No acute finding. Other: None. CT CERVICAL SPINE FINDINGS Alignment: No traumatic malalignment. Skull base and vertebrae: No acute fracture. Normal craniocervical junction. No suspicious bone lesions. Soft tissues and spinal canal: No prevertebral fluid or swelling. No visible canal hematoma. Disc levels: Multilevel cervical spondylosis, worst at C5-6, where there is at least mild spinal canal stenosis. Upper chest: No acute findings. Other: Atherosclerotic calcifications of the carotid bulbs. IMPRESSION: 1. No acute intracranial abnormality. 2. No acute cervical spine fracture or traumatic malalignment. 3. Multilevel  cervical spondylosis, worst at C5-6, where there is at least mild spinal canal stenosis. Electronically Signed   By: Orvan Falconer M.D.   On: 04/13/2023 15:09   CT Cervical Spine Wo Contrast Result Date: 04/13/2023 CLINICAL DATA:  Head trauma, minor, normal mental status (Age 43-64y); Neck trauma, uncomplicated (NEXUS/CCR neg) (Age 38-64y). Fall with head strike. Headache. EXAM: CT HEAD WITHOUT CONTRAST CT CERVICAL SPINE WITHOUT CONTRAST TECHNIQUE: Multidetector CT imaging of the head and cervical spine was performed following the standard protocol without intravenous contrast. Multiplanar CT image reconstructions of the cervical spine were also generated. RADIATION DOSE REDUCTION: This exam was performed according to the departmental dose-optimization program which includes automated exposure control, adjustment of the mA and/or kV according to patient size and/or use of iterative reconstruction technique. COMPARISON:  Head CT 03/06/2023.  MRI brain 02/05/2023. FINDINGS: CT HEAD FINDINGS Brain: No acute intracranial hemorrhage. Gray-white differentiation is preserved. No hydrocephalus or extra-axial collection. No mass effect or midline shift. Vascular: No hyperdense vessel or unexpected calcification.  Skull: No calvarial fracture or suspicious bone lesion. Skull base is unremarkable. Sinuses/Orbits: No acute finding. Other: None. CT CERVICAL SPINE FINDINGS Alignment: No traumatic malalignment. Skull base and vertebrae: No acute fracture. Normal craniocervical junction. No suspicious bone lesions. Soft tissues and spinal canal: No prevertebral fluid or swelling. No visible canal hematoma. Disc levels: Multilevel cervical spondylosis, worst at C5-6, where there is at least mild spinal canal stenosis. Upper chest: No acute findings. Other: Atherosclerotic calcifications of the carotid bulbs. IMPRESSION: 1. No acute intracranial abnormality. 2. No acute cervical spine fracture or traumatic malalignment. 3.  Multilevel cervical spondylosis, worst at C5-6, where there is at least mild spinal canal stenosis. Electronically Signed   By: Orvan Falconer M.D.   On: 04/13/2023 15:09    Procedures Procedures    Medications Ordered in ED Medications - No data to display  ED Course/ Medical Decision Making/ A&P                                 Medical Decision Making Amount and/or Complexity of Data Reviewed Radiology: ordered.   This patient presents to the ED with chief complaint(s) of mechanical fall .  The complaint involves an extensive differential diagnosis and also carries with it a high risk of complications and morbidity.   pertinent past medical history as listed in HPI  The differential diagnosis includes  Intracranial hemorrhage, fracture, TBI The initial plan is to  Obtain CT imaging Additional history obtained: Records reviewed Care Everywhere/External Records  Initial Assessment:   Patient presents hypertensive 160/80 with complaints of mechanical fall and head injury with persistent headache.  No neurodeficits on exam.  Patient is able to ambulate.  CT imaging negative.  Overall consistent with TBI.  Will provide info packet and encouraged to follow-up with PCP should symptoms persist.  Independent ECG interpretation:  none  Independent labs interpretation:  The following labs were independently interpreted:  none  Independent visualization and interpretation of imaging: I independently visualized the following imaging with scope of interpretation limited to determining acute life threatening conditions related to emergency care: CT head, which revealed no acute abnormality  Treatment and Reassessment: none  Consultations obtained:   none  Disposition:   Patient be discharged home.  Provided concussion protocol. The patient has been appropriately medically screened and/or stabilized in the ED. I have low suspicion for any other emergent medical condition which would  require further screening, evaluation or treatment in the ED or require inpatient management. At time of discharge the patient is hemodynamically stable and in no acute distress. I have discussed work-up results and diagnosis with patient and answered all questions. Patient is agreeable with discharge plan. We discussed strict return precautions for returning to the emergency department and they verbalized understanding.     Social Determinants of Health:   none  This note was dictated with voice recognition software.  Despite best efforts at proofreading, errors may have occurred which can change the documentation meaning.          Final Clinical Impression(s) / ED Diagnoses Final diagnoses:  Fall, initial encounter  Injury of head, initial encounter    Rx / DC Orders ED Discharge Orders     None         Halford Decamp, PA-C 04/13/23 1654    Vanetta Mulders, MD 04/13/23 2310

## 2023-11-30 ENCOUNTER — Other Ambulatory Visit: Payer: Self-pay | Admitting: Obstetrics and Gynecology

## 2023-11-30 DIAGNOSIS — Z1231 Encounter for screening mammogram for malignant neoplasm of breast: Secondary | ICD-10-CM
